# Patient Record
Sex: Male | Born: 1995 | Race: White | Hispanic: No | Marital: Single | Smoking: Never smoker
Health system: Southern US, Community
[De-identification: ages and names within clinical notes are randomized; demographics above are authoritative.]

## PROBLEM LIST (undated history)

## (undated) DIAGNOSIS — J302 Other seasonal allergic rhinitis: Secondary | ICD-10-CM

## (undated) DIAGNOSIS — S42309A Unspecified fracture of shaft of humerus, unspecified arm, initial encounter for closed fracture: Secondary | ICD-10-CM

## (undated) HISTORY — PX: TONSILLECTOMY: SUR1361

---
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
## 2015-02-24 ENCOUNTER — Emergency Department (HOSPITAL_COMMUNITY): Payer: BLUE CROSS/BLUE SHIELD

## 2015-02-24 ENCOUNTER — Encounter (HOSPITAL_COMMUNITY): Payer: Self-pay

## 2015-02-24 ENCOUNTER — Emergency Department (HOSPITAL_COMMUNITY)
Admission: EM | Admit: 2015-02-24 | Discharge: 2015-02-24 | Disposition: A | Discharge status: Home / Self Care | Payer: BLUE CROSS/BLUE SHIELD | Attending: Emergency Medicine | Admitting: Emergency Medicine

## 2015-02-24 DIAGNOSIS — Z79899 Other long term (current) drug therapy: Secondary | ICD-10-CM | POA: Diagnosis not present

## 2015-02-24 DIAGNOSIS — Z8781 Personal history of (healed) traumatic fracture: Secondary | ICD-10-CM | POA: Diagnosis not present

## 2015-02-24 DIAGNOSIS — J159 Unspecified bacterial pneumonia: Secondary | ICD-10-CM | POA: Diagnosis not present

## 2015-02-24 DIAGNOSIS — R05 Cough: Secondary | ICD-10-CM | POA: Diagnosis present

## 2015-02-24 DIAGNOSIS — J189 Pneumonia, unspecified organism: Secondary | ICD-10-CM

## 2015-02-24 LAB — URINALYSIS, ROUTINE W REFLEX MICROSCOPIC
Bilirubin Urine: NEGATIVE
GLUCOSE, UA: NEGATIVE mg/dL
HGB URINE DIPSTICK: NEGATIVE
Ketones, ur: 15 mg/dL — AB
Leukocytes, UA: NEGATIVE
Nitrite: NEGATIVE
PH: 7 (ref 5.0–8.0)
PROTEIN: 30 mg/dL — AB
SPECIFIC GRAVITY, URINE: 1.028 (ref 1.005–1.030)

## 2015-02-24 LAB — COMPREHENSIVE METABOLIC PANEL
ALBUMIN: 3.6 g/dL (ref 3.5–5.0)
ALT: 18 U/L (ref 17–63)
AST: 30 U/L (ref 15–41)
Alkaline Phosphatase: 56 U/L (ref 38–126)
Anion gap: 12 (ref 5–15)
BILIRUBIN TOTAL: 0.5 mg/dL (ref 0.3–1.2)
BUN: 9 mg/dL (ref 6–20)
CO2: 22 mmol/L (ref 22–32)
Calcium: 8.7 mg/dL — ABNORMAL LOW (ref 8.9–10.3)
Chloride: 102 mmol/L (ref 101–111)
Creatinine, Ser: 1.22 mg/dL (ref 0.61–1.24)
GFR calc Af Amer: >60 mL/min (ref >60)
GFR calc non Af Amer: >60 mL/min (ref >60)
GLUCOSE: 113 mg/dL — AB (ref 65–99)
POTASSIUM: 3.7 mmol/L (ref 3.5–5.1)
SODIUM: 136 mmol/L (ref 135–145)
TOTAL PROTEIN: 6.6 g/dL (ref 6.5–8.1)

## 2015-02-24 LAB — CBC WITH DIFFERENTIAL/PLATELET
Basophils Absolute: 0 10*3/uL (ref 0.0–0.1)
Basophils Relative: 0 %
EOS PCT: 1 %
Eosinophils Absolute: 0.1 10*3/uL (ref 0.0–0.7)
HEMATOCRIT: 39.7 % (ref 39.0–52.0)
Hemoglobin: 14 g/dL (ref 13.0–17.0)
LYMPHS ABS: 0.8 10*3/uL (ref 0.7–4.0)
LYMPHS PCT: 15 %
MCH: 29.2 pg (ref 26.0–34.0)
MCHC: 35.3 g/dL (ref 30.0–36.0)
MCV: 82.7 fL (ref 78.0–100.0)
MONO ABS: 0.4 10*3/uL (ref 0.1–1.0)
MONOS PCT: 8 %
NEUTROS ABS: 4.2 10*3/uL (ref 1.7–7.7)
Neutrophils Relative %: 76 %
PLATELETS: 147 10*3/uL — AB (ref 150–400)
RBC: 4.8 MIL/uL (ref 4.22–5.81)
RDW: 12.4 % (ref 11.5–15.5)
WBC: 5.5 10*3/uL (ref 4.0–10.5)

## 2015-02-24 LAB — URINE MICROSCOPIC-ADD ON: RBC / HPF: NONE SEEN RBC/hpf (ref 0–5)

## 2015-02-24 LAB — I-STAT CG4 LACTIC ACID, ED
Lactic Acid, Venous: 0.96 mmol/L (ref 0.5–2.0)
Lactic Acid, Venous: 0.87 mmol/L (ref 0.5–2.0)

## 2015-02-24 MED ORDER — AMOXICILLIN 500 MG PO CAPS: 1000.0000 mg | ORAL_CAPSULE | Freq: Three times a day (TID) | ORAL | Status: AC

## 2015-02-24 MED ORDER — AZITHROMYCIN 250 MG PO TABS: 250.0000 mg | ORAL_TABLET | Freq: Every day | ORAL | Status: AC

## 2015-02-24 NOTE — Discharge Instructions (Signed)

## 2015-02-24 NOTE — ED Notes (Addendum)
Pt started running a fever Sunday and has had a dry cough. Fever has been running 102 and getting up to 104 at night the last few nights. Went to the doctor Tuesday and tested for flu, was negative. Thought it was more virus. Called Doc on call and told to come here for xray and blood work. 2 extra strenth Tylenol at 1230 and 2 aleve at 0100. Hycodan cough syrup at 2230.

## 2015-02-25 ENCOUNTER — Telehealth (HOSPITAL_BASED_OUTPATIENT_CLINIC_OR_DEPARTMENT_OTHER): Payer: Self-pay | Admitting: Emergency Medicine

## 2015-02-25 LAB — URINE CULTURE

## 2015-02-28 NOTE — ED Provider Notes (Signed)
CSN: [id number]     Arrival date & time 02/24/15  0142 History   First MD Initiated Contact with Patient 02/24/15 [phone]     Chief Complaint  Patient presents with  . Fever  . Cough     (Consider location/radiation/quality/duration/timing/severity/associated sxs/prior Treatment) HPI Comments: Pt comes in with cc of fevers and cough. He has no medical hx, and ? 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 Pt has been having fever and cough since Sunday. He was checked for flu and it was neg. Symptoms getting worse, so advised to come to the ER. Pt has no sig medical hx.   Patient is a 20 y.o. male presenting with fever and cough. The history is provided by the patient.  Fever Associated symptoms: chest pain and cough   Associated symptoms: no dysuria   Cough Associated symptoms: chest pain and fever   Associated symptoms: no shortness of breath and no wheezing     Past Medical History  Diagnosis Date  . Arm fracture     right arm  . Seasonal allergies    Past Surgical History  Procedure Laterality Date  . Tonsillectomy     No family history on file. Social History  Substance Use Topics  . Smoking status: Never Smoker   . Smokeless tobacco: None  . Alcohol Use: No    Review of Systems  Constitutional: Positive for fever. Negative for activity change and appetite change.  Respiratory: Positive for cough. Negative for shortness of breath and wheezing.   Cardiovascular: Positive for chest pain.  Gastrointestinal: Negative for abdominal pain.  Genitourinary: Negative for dysuria.      Allergies  Review of patient's allergies indicates no known allergies.  Home Medications   Prior to Admission medications   Medication Sig Start Date End Date Taking? Authorizing Provider  amoxicillin (AMOXIL) 500 MG capsule Take 2 capsules (1,000 mg total) by mouth 3 (three) times daily. 02/24/15   Derwood KaplanAnkit Mekiyah Gladwell, MD  azithromycin (ZITHROMAX) 250 MG tablet Take 1 tablet (250 mg total) by  mouth daily. Take first 2 tablets together, then 1 every day until finished. 02/24/15   Derwood KaplanAnkit Forest Redwine, MD  diphenhydrAMINE (BENADRYL) 25 MG tablet Take 25 mg by mouth at bedtime as needed. For allergies    Historical Provider, MD  fexofenadine (ALLEGRA) 180 MG tablet Take 180 mg by mouth daily.    Historical Provider, MD   BP 126/76 mmHg  Pulse 74  Temp(Src) 101.2 F (38.4 C)  Resp 14  SpO2 97% Physical Exam  Constitutional: He is oriented to person, place, and time. He appears well-developed.  HENT:  Head: Atraumatic.  Neck: Neck supple. No JVD present.  Cardiovascular: Normal rate.   Pulmonary/Chest: Effort normal. No respiratory distress. He has no wheezes.  + rhonchi  Neurological: He is alert and oriented to person, place, and time.  Skin: Skin is warm.  Nursing note and vitals reviewed.   ED Course  Procedures (including critical care time) Labs Review Labs Reviewed  COMPREHENSIVE METABOLIC PANEL - Abnormal; Notable for the following:    Glucose, Bld 113 (*)    Calcium 8.7 (*)    All other components within normal limits  URINALYSIS, ROUTINE W REFLEX MICROSCOPIC (NOT AT [facility]) - Abnormal; Notable for the following:    Ketones, ur 15 (*)    Protein, ur 30 (*)    All other components within normal limits  CBC WITH DIFFERENTIAL/PLATELET - Abnormal; Notable for the following:    Platelets 147 (*)  All other components within normal limits  URINE MICROSCOPIC-ADD ON - Abnormal; Notable for the following:    Squamous Epithelial / LPF 0-5 (*)    Bacteria, UA RARE (*)    All other components within normal limits  CULTURE, BLOOD (ROUTINE X 2)  CULTURE, BLOOD (ROUTINE X 2)  URINE CULTURE  I-STAT CG4 LACTIC ACID, ED  I-STAT CG4 LACTIC ACID, ED    Imaging Review No results found. I have personally reviewed and evaluated these images and lab results as part of my medical decision-making.   EKG Interpretation None      MDM   Final diagnoses:  CAP (community  acquired pneumonia)   Pt comes in with cc of chest pain. L sided has rhonchus - and his Xray confirms a pneumonia. Young and healthy - safe to be discharged and treated as an outpatient.     Derwood Kaplan, MD 02/28/15 [phone]

## 2015-03-01 LAB — CULTURE, BLOOD (ROUTINE X 2)
Culture: NO GROWTH
Culture: NO GROWTH

## 2015-03-20 ENCOUNTER — Emergency Department (HOSPITAL_COMMUNITY): Payer: BLUE CROSS/BLUE SHIELD

## 2015-03-20 ENCOUNTER — Emergency Department (HOSPITAL_COMMUNITY)
Admission: EM | Admit: 2015-03-20 | Discharge: 2015-03-20 | Disposition: A | Discharge status: Home / Self Care | Payer: BLUE CROSS/BLUE SHIELD | Attending: Emergency Medicine | Admitting: Emergency Medicine

## 2015-03-20 ENCOUNTER — Encounter (HOSPITAL_COMMUNITY): Payer: Self-pay | Admitting: Emergency Medicine

## 2015-03-20 DIAGNOSIS — Z79899 Other long term (current) drug therapy: Secondary | ICD-10-CM | POA: Insufficient documentation

## 2015-03-20 DIAGNOSIS — Z8781 Personal history of (healed) traumatic fracture: Secondary | ICD-10-CM | POA: Diagnosis not present

## 2015-03-20 DIAGNOSIS — J069 Acute upper respiratory infection, unspecified: Secondary | ICD-10-CM | POA: Insufficient documentation

## 2015-03-20 DIAGNOSIS — R05 Cough: Secondary | ICD-10-CM | POA: Diagnosis present

## 2015-03-20 MED ORDER — IPRATROPIUM-ALBUTEROL 0.5-2.5 (3) MG/3ML IN SOLN
3.0000 mL | Freq: Once | RESPIRATORY_TRACT | Status: AC
  Administered 2015-03-20: 3 mL via RESPIRATORY_TRACT
  Filled 2015-03-20: qty 3

## 2015-03-20 MED ORDER — IBUPROFEN 800 MG PO TABS
800.0000 mg | ORAL_TABLET | Freq: Once | ORAL | Status: DC
  Filled 2015-03-20: qty 1

## 2015-03-20 MED ORDER — IBUPROFEN 800 MG PO TABS: 800.0000 mg | ORAL_TABLET | Freq: Three times a day (TID) | ORAL | Status: AC | PRN

## 2015-03-20 MED ORDER — SODIUM CHLORIDE 0.9 % IV BOLUS (SEPSIS)
1000.0000 mL | Freq: Once | INTRAVENOUS | Status: AC
  Administered 2015-03-20: 1000 mL via INTRAVENOUS

## 2015-03-20 MED ORDER — ACETAMINOPHEN 500 MG PO TABS
1000.0000 mg | ORAL_TABLET | Freq: Once | ORAL | Status: AC
  Administered 2015-03-20: 1000 mg via ORAL
  Filled 2015-03-20: qty 2

## 2015-03-20 MED ORDER — IBUPROFEN 200 MG PO TABS
400.0000 mg | ORAL_TABLET | Freq: Once | ORAL | Status: AC
  Administered 2015-03-20: 400 mg via ORAL
  Filled 2015-03-20: qty 2

## 2015-03-20 MED ORDER — PROMETHAZINE-DM 6.25-15 MG/5ML PO SYRP: 5.0000 mL | ORAL_SOLUTION | Freq: Four times a day (QID) | ORAL | Status: AC | PRN

## 2015-03-20 MED ORDER — GUAIFENESIN ER 1200 MG PO TB12: 1.0000 | ORAL_TABLET | Freq: Two times a day (BID) | ORAL | Status: AC

## 2015-03-20 NOTE — ED Notes (Signed)
Pt states he was seen at the beginning of the month for PNA and was given RX and sent home. States that his symptoms are coming back. WAnts repeat chest XR. Alert and oriented.

## 2015-03-20 NOTE — ED Provider Notes (Signed)
CSN: [id number]     Arrival date & time 03/20/15  2018 History   First MD Initiated Contact with Patient 03/20/15 2042     Chief Complaint  Patient presents with  . Cough     (Consider location/radiation/quality/duration/timing/severity/associated sxs/prior Treatment) HPI Patient presents to the emergency department with cough and fever.  The patient was recently diagnosed 3 weeks ago with pneumonia.  He said he seemed improved, but then over the last couple days, started having cough and fever again.  Patient states he was around some people that have similar symptoms.  Patient denies nausea, vomiting, weakness, headache, blurred vision, back pain, neck pain, dysuria, incontinence, rash, abdominal pain, chest pain, shortness of breath, near syncope or syncope.  The patient states that he did take some over-the-counter Mucinex Past Medical History  Diagnosis Date  . Arm fracture     right arm  . Seasonal allergies    Past Surgical History  Procedure Laterality Date  . Tonsillectomy     History reviewed. No pertinent family history. Social History  Substance Use Topics  . Smoking status: Never Smoker   . Smokeless tobacco: None  . Alcohol Use: No    Review of Systems   All other systems negative except as documented in the HPI. All pertinent positives and negatives as reviewed in the HPI.= Allergies  Review of patient's allergies indicates no known allergies.  Home Medications   Prior to Admission medications   Medication Sig Start Date End Date Taking? Authorizing Provider  fexofenadine (ALLEGRA) 180 MG tablet Take 180 mg by mouth daily.   Yes Historical Provider, MD  guaiFENesin (MUCINEX) 600 MG 12 hr tablet Take 600 mg by mouth 2 (two) times daily as needed for cough.   Yes Historical Provider, MD  Pseudoeph-Doxylamine-DM-APAP (NYQUIL PO) Take 1 capsule by mouth once.   Yes Historical Provider, MD  amoxicillin (AMOXIL) 500 MG capsule Take 2 capsules (1,000 mg total) by  mouth 3 (three) times daily. Patient not taking: Reported on 03/20/2015 02/24/15   Derwood Kaplan, MD  azithromycin (ZITHROMAX) 250 MG tablet Take 1 tablet (250 mg total) by mouth daily. Take first 2 tablets together, then 1 every day until finished. Patient not taking: Reported on 03/20/2015 02/24/15   Derwood Kaplan, MD   BP 142/65 mmHg  Pulse 84  Temp(Src) 98.7 F (37.1 C) (Oral)  Resp 20  SpO2 98% Physical Exam  Constitutional: He is oriented to person, place, and time. He appears well-developed and well-nourished. No distress.  HENT:  Head: Normocephalic and atraumatic.  Mouth/Throat: Oropharynx is clear and moist.  Eyes: Pupils are equal, round, and reactive to light.  Neck: Normal range of motion. Neck supple.  Cardiovascular: Normal rate, regular rhythm and normal heart sounds.  Exam reveals no gallop and no friction rub.   No murmur heard. Pulmonary/Chest: Effort normal and breath sounds normal. No respiratory distress. He has no wheezes.  Neurological: He is alert and oriented to person, place, and time. He exhibits normal muscle tone. Coordination normal.  Skin: Skin is warm and dry. No rash noted. No erythema.  Psychiatric: He has a normal mood and affect. His behavior is normal.  Nursing note and vitals reviewed.   ED Course  Procedures (including critical care time) Labs Review Labs Reviewed - No data to display  Imaging Review Dg Chest 2 View  03/20/2015  CLINICAL DATA:  Recurrent productive cough. Fever. Recent pneumonia. EXAM: CHEST  2 VIEW COMPARISON:  02/24/2015 FINDINGS: The heart size  and mediastinal contours are within normal limits. Both lungs are clear. Previously seen left lower lobe airspace disease has resolved since previous study. No evidence of pneumothorax or pleural effusion. The visualized skeletal structures are unremarkable. IMPRESSION: Interval resolution of left lower lobe pneumonia. No active cardiopulmonary disease. Electronically Signed   By: Myles Rosenthal M.D.   On: 03/20/2015 20:48   I have personally reviewed and evaluated these images and lab results as part of my medical decision-making.  I did explain to the patient.  This could represent early pneumonia but most likely represents a viral infection and he was worse in due to the fact these had a recent infection.  Patient agrees the plan and all questions were answered.  I did advise him to follow-up with his primary care Dr. for any worsening in his condition.  Patient is told to return here if he is having any worsening in his condition.   Charlestine Night, PA-C 03/20/15 2254  Richardean Canal, MD 03/22/15 [phone]

## 2015-03-20 NOTE — Discharge Instructions (Signed)
Return here as needed.  Follow-up with your primary care doctor, increase your fluid intake and rest as much as possible °

## 2017-01-03 IMAGING — CR DG CHEST 2V
2 series · 2 of 2 positions shown · non-contrast
Comparison: 02/24/2015

CLINICAL DATA: Recurrent productive cough. Fever. Recent pneumonia.

EXAM:
CHEST  2 VIEW

[w chest pa]
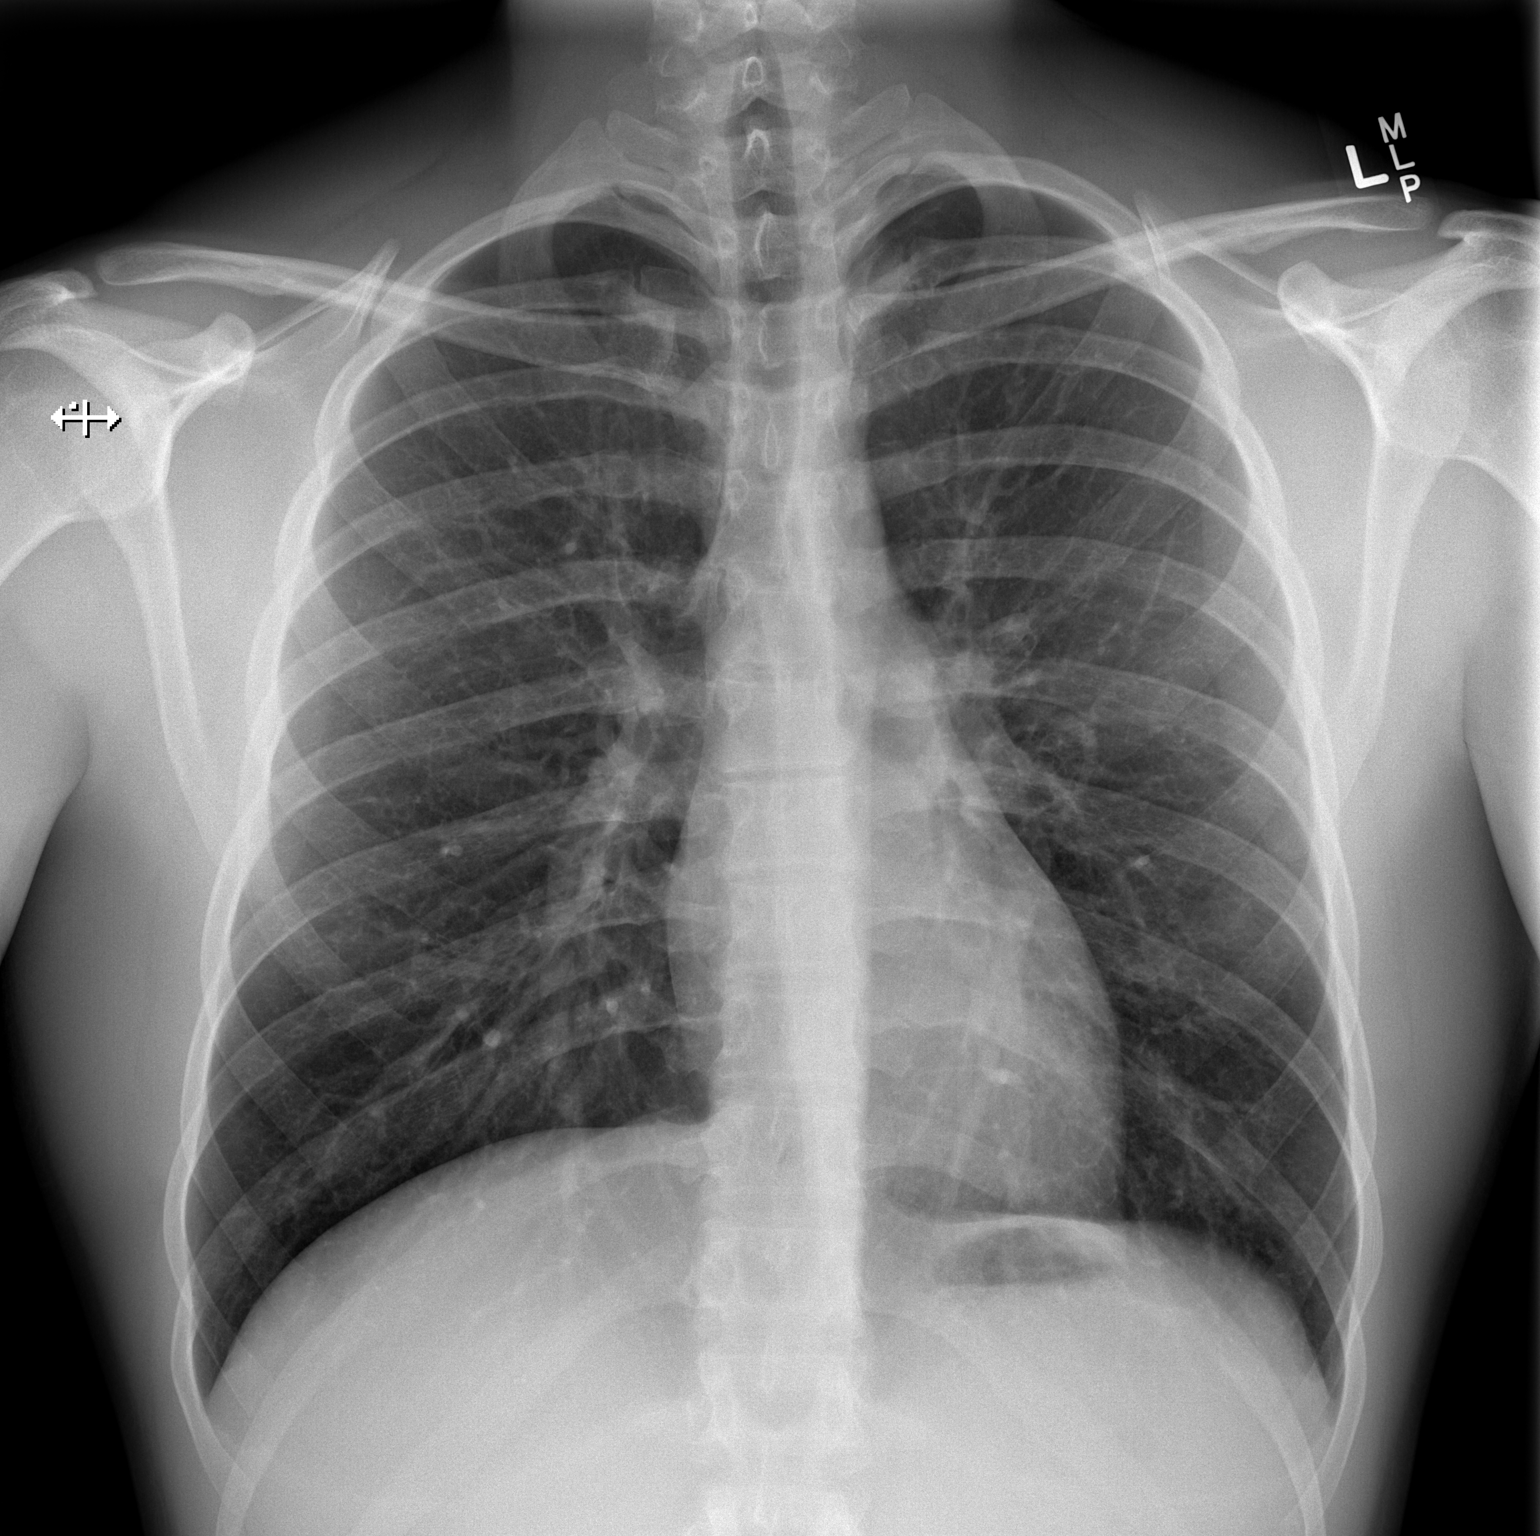

[w chest lat]
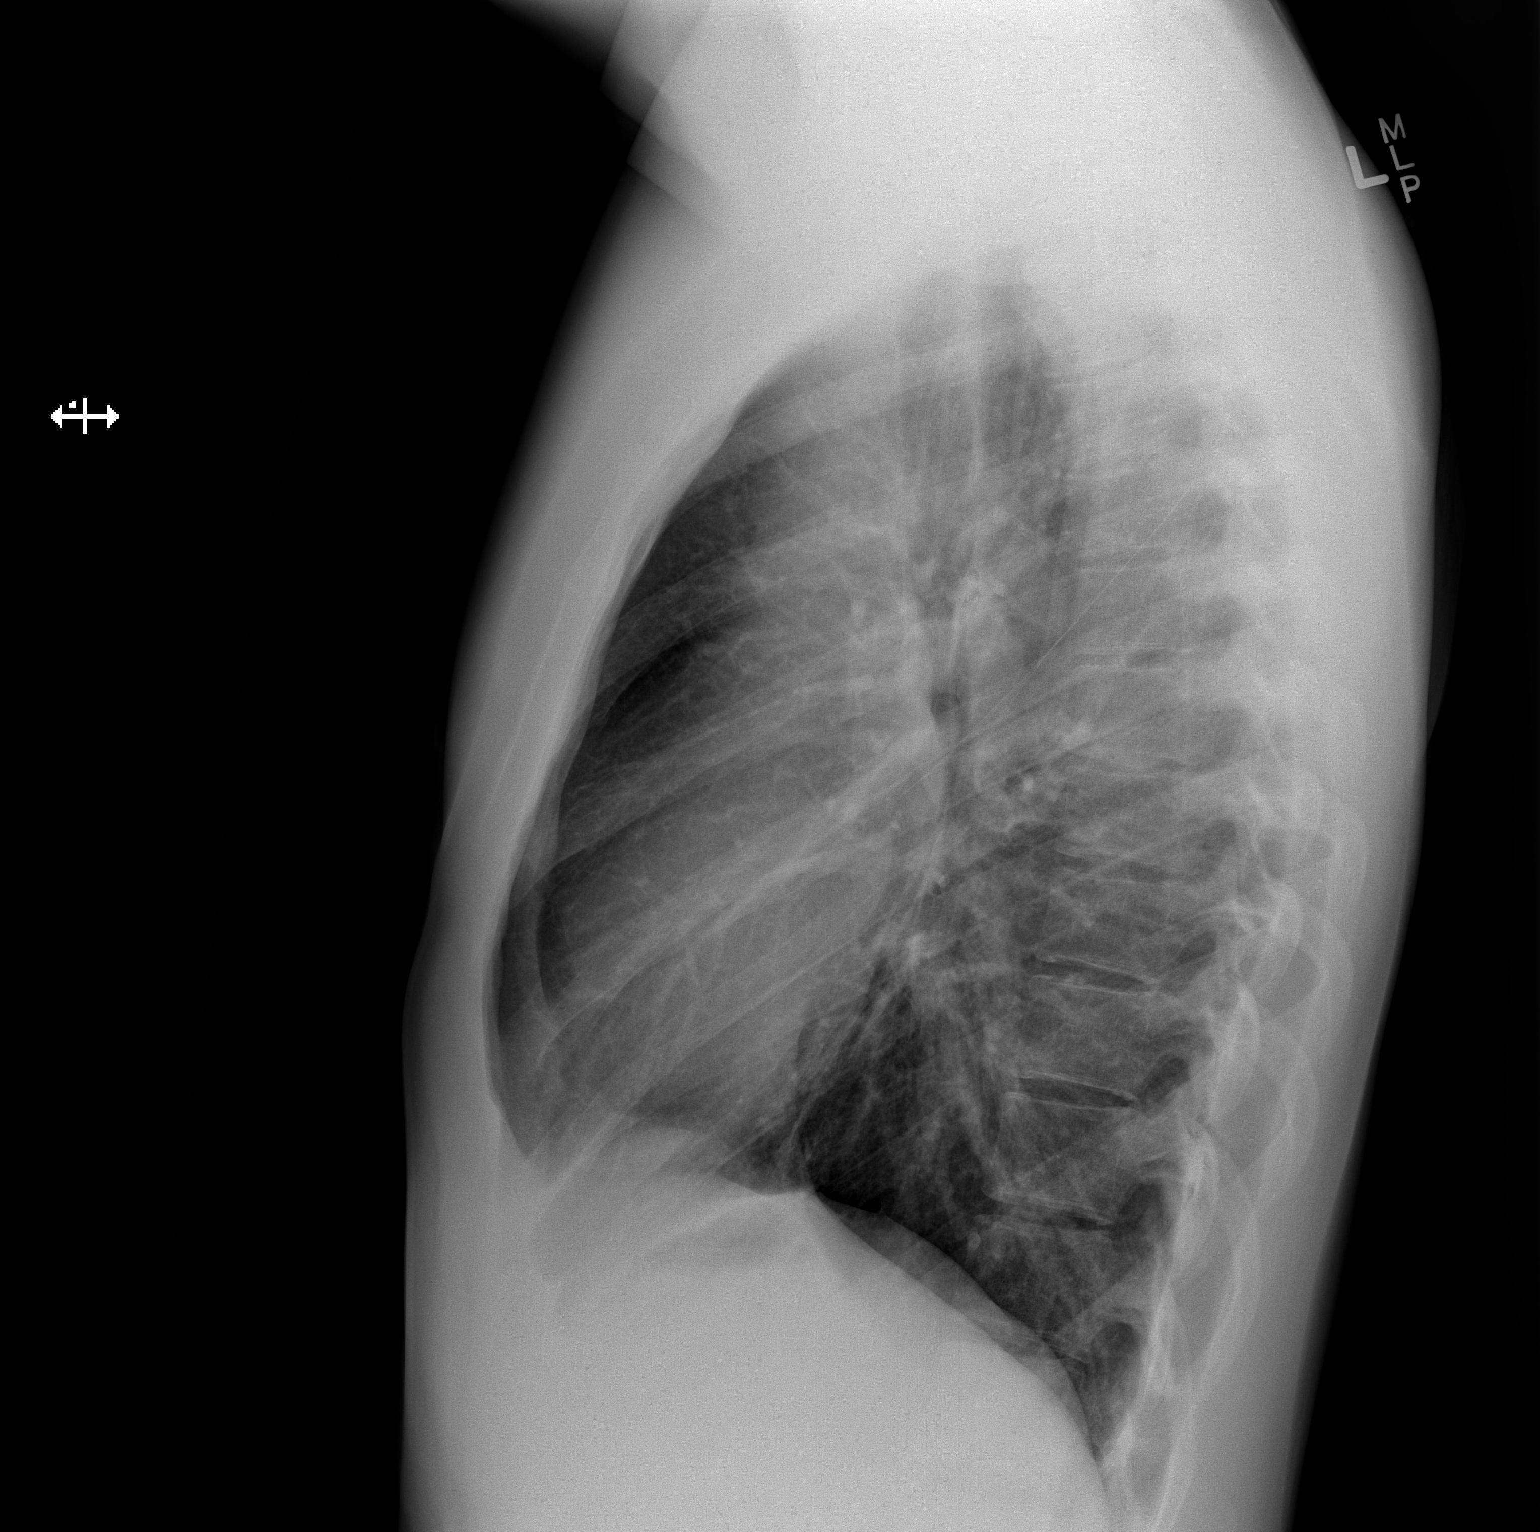

[2 of 2 positions shown; findings below may reference images not displayed]

FINDINGS: The heart size and mediastinal contours are within normal limits.
Both lungs are clear. Previously seen left lower lobe airspace
disease has resolved since previous study. No evidence of
pneumothorax or pleural effusion. The visualized skeletal structures
are unremarkable.
IMPRESSION: Interval resolution of left lower lobe pneumonia. No active
cardiopulmonary disease.
